# Patient Record
Sex: Male | Born: 2000 | Race: Black or African American | Hispanic: No | Marital: Single | Smoking: Former smoker
Health system: Southern US, Community
[De-identification: ages and names within clinical notes are randomized; demographics above are authoritative.]

## PROBLEM LIST (undated history)

## (undated) HISTORY — PX: SHOULDER SURGERY: SHX246

---
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
## 2021-06-25 ENCOUNTER — Encounter (HOSPITAL_COMMUNITY): Payer: Self-pay

## 2021-06-25 ENCOUNTER — Other Ambulatory Visit: Payer: Self-pay

## 2021-06-25 ENCOUNTER — Emergency Department (HOSPITAL_COMMUNITY)
Admission: EM | Admit: 2021-06-25 | Discharge: 2021-06-25 | Disposition: A | Discharge status: Home / Self Care | Payer: PRIVATE HEALTH INSURANCE | Attending: Emergency Medicine | Admitting: Emergency Medicine

## 2021-06-25 ENCOUNTER — Emergency Department (HOSPITAL_COMMUNITY): Payer: PRIVATE HEALTH INSURANCE

## 2021-06-25 DIAGNOSIS — R1013 Epigastric pain: Secondary | ICD-10-CM | POA: Diagnosis present

## 2021-06-25 DIAGNOSIS — R112 Nausea with vomiting, unspecified: Secondary | ICD-10-CM | POA: Insufficient documentation

## 2021-06-25 LAB — CBC WITH DIFFERENTIAL/PLATELET
Abs Immature Granulocytes: 0 10*3/uL (ref 0.00–0.07)
Basophils Absolute: 0.1 10*3/uL (ref 0.0–0.1)
Basophils Relative: 1 %
Eosinophils Absolute: 0.3 10*3/uL (ref 0.0–0.5)
Eosinophils Relative: 6 %
HCT: 42.6 % (ref 39.0–52.0)
Hemoglobin: 14.8 g/dL (ref 13.0–17.0)
Immature Granulocytes: 0 %
Lymphocytes Relative: 50 %
Lymphs Abs: 2.2 10*3/uL (ref 0.7–4.0)
MCH: 31.2 pg (ref 26.0–34.0)
MCHC: 34.7 g/dL (ref 30.0–36.0)
MCV: 89.7 fL (ref 80.0–100.0)
Monocytes Absolute: 0.4 10*3/uL (ref 0.1–1.0)
Monocytes Relative: 8 %
Neutro Abs: 1.5 10*3/uL — ABNORMAL LOW (ref 1.7–7.7)
Neutrophils Relative %: 35 %
Platelets: 229 10*3/uL (ref 150–400)
RBC: 4.75 MIL/uL (ref 4.22–5.81)
RDW: 13.1 % (ref 11.5–15.5)
WBC: 4.4 10*3/uL (ref 4.0–10.5)
nRBC: 0 % (ref 0.0–0.2)

## 2021-06-25 LAB — URINALYSIS, ROUTINE W REFLEX MICROSCOPIC
Bacteria, UA: NONE SEEN
Bilirubin Urine: NEGATIVE
Glucose, UA: NEGATIVE mg/dL
Hgb urine dipstick: NEGATIVE
Ketones, ur: NEGATIVE mg/dL
Leukocytes,Ua: NEGATIVE
Nitrite: NEGATIVE
Protein, ur: NEGATIVE mg/dL
Specific Gravity, Urine: 1.009 (ref 1.005–1.030)
pH: 6 (ref 5.0–8.0)

## 2021-06-25 LAB — COMPREHENSIVE METABOLIC PANEL
ALT: 13 U/L (ref 0–44)
AST: 15 U/L (ref 15–41)
Albumin: 4.5 g/dL (ref 3.5–5.0)
Alkaline Phosphatase: 45 U/L (ref 38–126)
Anion gap: 4 — ABNORMAL LOW (ref 5–15)
BUN: 9 mg/dL (ref 6–20)
CO2: 27 mmol/L (ref 22–32)
Calcium: 8.8 mg/dL — ABNORMAL LOW (ref 8.9–10.3)
Chloride: 108 mmol/L (ref 98–111)
Creatinine, Ser: 1.18 mg/dL (ref 0.61–1.24)
GFR, Estimated: >60 mL/min (ref >60)
Glucose, Bld: 93 mg/dL (ref 70–99)
Potassium: 3.9 mmol/L (ref 3.5–5.1)
Sodium: 139 mmol/L (ref 135–145)
Total Bilirubin: 0.5 mg/dL (ref 0.3–1.2)
Total Protein: 7.3 g/dL (ref 6.5–8.1)

## 2021-06-25 LAB — LIPASE, BLOOD: Lipase: 27 U/L (ref 11–51)

## 2021-06-25 MED ORDER — METOCLOPRAMIDE HCL 10 MG PO TABS: 10.0000 mg | ORAL_TABLET | Freq: Four times a day (QID) | ORAL | 0 refills | Status: AC

## 2021-06-25 MED ORDER — LIDOCAINE VISCOUS HCL 2 % MT SOLN
15.0000 mL | Freq: Once | OROMUCOSAL | Status: AC
  Administered 2021-06-25: 15 mL via ORAL
  Filled 2021-06-25: qty 15

## 2021-06-25 MED ORDER — ALUM & MAG HYDROXIDE-SIMETH 200-200-20 MG/5ML PO SUSP
30.0000 mL | Freq: Once | ORAL | Status: AC
  Administered 2021-06-25: 30 mL via ORAL
  Filled 2021-06-25: qty 30

## 2021-06-25 MED ORDER — METOCLOPRAMIDE HCL 10 MG PO TABS
10.0000 mg | ORAL_TABLET | Freq: Once | ORAL | Status: AC
  Administered 2021-06-25: 10 mg via ORAL
  Filled 2021-06-25: qty 1

## 2021-06-25 MED ORDER — FAMOTIDINE 20 MG PO TABS
40.0000 mg | ORAL_TABLET | Freq: Once | ORAL | Status: AC
  Administered 2021-06-25: 40 mg via ORAL
  Filled 2021-06-25: qty 2

## 2021-06-25 MED ORDER — DICYCLOMINE HCL 20 MG PO TABS: 20.0000 mg | ORAL_TABLET | Freq: Two times a day (BID) | ORAL | 0 refills | Status: AC

## 2021-06-25 MED ORDER — PANTOPRAZOLE SODIUM 20 MG PO TBEC: 20.0000 mg | DELAYED_RELEASE_TABLET | Freq: Every day | ORAL | 0 refills | Status: AC

## 2021-06-25 NOTE — ED Triage Notes (Signed)
Pt reports with a pain in his sternum that goes through to his back. Pt reports feeling this pain for the past month and more after he eats or takes a deep breath.  ?

## 2021-06-25 NOTE — Discharge Instructions (Addendum)
Please take medications as prescribed.  Please follow-up with one of the gastroenterology group that I have given you the information for. ? ?Recommend bland foods.  I have printed you instructions on this diet. ?

## 2021-06-25 NOTE — ED Provider Notes (Signed)
?[facility] DEPT ?Provider Note ? ? ?CSN: [id number] ?Arrival date & time: 06/25/21  [id number] ? ?  ? ?History ? ?Chief Complaint  ?Patient presents with  ? Abdominal Pain  ? Back Pain  ? ? ?Eugene Johnston is a 21 y.o. male. ? ? ?Abdominal Pain ?Back Pain ?Associated symptoms: abdominal pain   ? ?Patient is a 21 year old male presented to the emergency room today with complaints of epigastric abdominal pain seems to radiate to his back.  He has no history of gallstones, pancreatitis denies alcohol use apart from occasional drinks a couple times a month.  He states that the pain seems to occur more after he eats especially heavy meals.  He endorses some nausea and had 2 episodes of vomiting over the past 24 hours that were nonbloody nonbilious.  Denies any dark or tarry appearing stool.  States occasionally have some mild loose stool no watery diarrhea.  No fevers or chills. ? ?No shortness of breath or lightheadedness.   ? ?  ? ?Home Medications ?Prior to Admission medications   ?Medication Sig Start Date End Date Taking? Authorizing Provider  ?dicyclomine (BENTYL) 20 MG tablet Take 1 tablet (20 mg total) by mouth 2 (two) times daily. 06/25/21  Yes Tedd Sias, PA  ?metoCLOPramide (REGLAN) 10 MG tablet Take 1 tablet (10 mg total) by mouth every 6 (six) hours. 06/25/21  Yes Josiel Gahm, Ova Freshwater S, PA  ?pantoprazole (PROTONIX) 20 MG tablet Take 1 tablet (20 mg total) by mouth daily. 06/25/21  Yes Tedd Sias, PA  ?doxycycline (VIBRAMYCIN) 100 MG capsule Take 1 capsule (100 mg total) by mouth 2 (two) times daily. One po bid x 7 days 01/14/18   Milton Ferguson, MD  ?ibuprofen (MOTRIN IB) 200 MG tablet Take 3 tablets (600 mg total) by mouth every 6 (six) hours as needed. ?Patient not taking: Reported on 01/14/2018 07/29/17   Carrie Mew, MD  ?traMADol (ULTRAM) 50 MG tablet Take 1 tablet (50 mg total) by mouth every 6 (six) hours as needed. 01/14/18   Milton Ferguson, MD  ?   ? ?Allergies     ?Benadryl [diphenhydramine]   ? ?Review of Systems   ?Review of Systems  ?Gastrointestinal:  Positive for abdominal pain.  ?Musculoskeletal:  Positive for back pain.  ? ?Physical Exam ?Updated Vital Signs ?BP (!) 148/93   Pulse 79   Temp 98.2 ?F (36.8 ?C) (Oral)   Resp 16   Ht 5\' 9"  (1.753 m)   Wt 79.4 kg   SpO2 100%   BMI 25.84 kg/m?  ?Physical Exam ?Vitals and nursing note reviewed.  ?Constitutional:   ?   General: He is not in acute distress. ?HENT:  ?   Head: Normocephalic and atraumatic.  ?   Nose: Nose normal.  ?Eyes:  ?   General: No scleral icterus. ?Cardiovascular:  ?   Rate and Rhythm: Normal rate and regular rhythm.  ?   Pulses: Normal pulses.  ?   Heart sounds: Normal heart sounds.  ?Pulmonary:  ?   Effort: Pulmonary effort is normal. No respiratory distress.  ?   Breath sounds: No wheezing.  ?Abdominal:  ?   Palpations: Abdomen is soft.  ?   Tenderness: There is abdominal tenderness in the epigastric area.  ?   Comments: Mild epigastric TTP ?No RUQ TTP, no guarding or rebound.  Negative Murphy sign  ?Musculoskeletal:  ?   Cervical back: Normal range of motion.  ?   Right lower leg: No  edema.  ?   Left lower leg: No edema.  ?Skin: ?   General: Skin is warm and dry.  ?   Capillary Refill: Capillary refill takes less than 2 seconds.  ?Neurological:  ?   Mental Status: He is alert. Mental status is at baseline.  ?Psychiatric:     ?   Mood and Affect: Mood normal.     ?   Behavior: Behavior normal.  ? ? ?ED Results / Procedures / Treatments   ?Labs ?(all labs ordered are listed, but only abnormal results are displayed) ?Labs Reviewed  ?COMPREHENSIVE METABOLIC PANEL - Abnormal; Notable for the following components:  ?    Result Value  ? Calcium 8.8 (*)   ? Anion gap 4 (*)   ? All other components within normal limits  ?CBC WITH DIFFERENTIAL/PLATELET - Abnormal; Notable for the following components:  ? Neutro Abs 1.5 (*)   ? All other components within normal limits  ?URINALYSIS, ROUTINE W REFLEX  MICROSCOPIC - Abnormal; Notable for the following components:  ? Color, Urine STRAW (*)   ? All other components within normal limits  ?LIPASE, BLOOD  ? ? ?EKG ?None ? ?Radiology ?DG Chest 2 View ? ?Result Date: 06/25/2021 ?CLINICAL DATA:  Upper abdominal pain, mid chest pain. EXAM: CHEST - 2 VIEW COMPARISON:  01/14/2018. FINDINGS: The heart size and mediastinal contours are within normal limits. No consolidation, effusion, or pneumothorax. No acute osseous abnormality. IMPRESSION: No active cardiopulmonary disease. Electronically Signed   By: Brett Fairy M.D.   On: 06/25/2021 04:18   ? ?Procedures ?Procedures  ? ? ?Medications Ordered in ED ?Medications  ?alum & mag hydroxide-simeth (MAALOX/MYLANTA) 200-200-20 MG/5ML suspension 30 mL (30 mLs Oral Given 06/25/21 0349)  ?  And  ?lidocaine (XYLOCAINE) 2 % viscous mouth solution 15 mL (15 mLs Oral Given 06/25/21 0349)  ?famotidine (PEPCID) tablet 40 mg (40 mg Oral Given 06/25/21 0401)  ?metoCLOPramide (REGLAN) tablet 10 mg (10 mg Oral Given 06/25/21 0400)  ? ? ?ED Course/ Medical Decision Making/ A&P ?Clinical Course as of 06/25/21 0444  ?Tue Jun 25, 2021  ?0421 I personally reviewed x-ray of chest images.  Agree with radiology read no acute abnormality. ?No pneumothorax and no air under diaphragm.  Doubt perforation [WF]  ?  ?Clinical Course User Index ?[WF] Tedd Sias, [place]  ? ?                        ?Medical Decision Making ?Amount and/or Complexity of Data Reviewed ?Labs: ordered. ?Radiology: ordered. ? ?Risk ?OTC drugs. ?Prescription drug management. ? ? ?This patient presents to the ED for concern of epigastric abd pain, this involves a number of treatment options, and is a complaint that carries with it a moderate/high risk of complications and morbidity.  The differential diagnosis includes The causes of generalized abdominal pain include but are not limited to AAA, mesenteric ischemia, appendicitis, diverticulitis, DKA, gastritis, gastroenteritis, AMI,  nephrolithiasis, pancreatitis, peritonitis, adrenal insufficiency,lead poisoning, iron toxicity, intestinal ischemia, constipation, UTI,SBO/LBO, splenic rupture, biliary disease, IBD, IBS, PUD, or hepatitis. ? ? ?Given the patient's epigastric tenderness on palpation I have a low suspicion for chest etiology of his symptoms. ? ? ?I did consider chest etiology such as pneumothorax, ACS, myocarditis, pericarditis ? ?Low suspicion for this given the epigastric tenderness ? ?Co morbidities: ?Discussed in HPI ? ? ?Brief History: ? ?Patient is a 21 year old male presented to the emergency room today with complaints of epigastric abdominal  pain seems to radiate to his back.  He has no history of gallstones, pancreatitis denies alcohol use apart from occasional drinks a couple times a month.  He states that the pain seems to occur more after he eats especially heavy meals.  He endorses some nausea and had 2 episodes of vomiting over the past 24 hours that were nonbloody nonbilious.  Denies any dark or tarry appearing stool.  States occasionally have some mild loose stool no watery diarrhea.  No fevers or chills. ? ?No shortness of breath or lightheadedness.   ? ? ? ?Physical exam with mild epigastric tenderness.  No guarding or rebound. ? ? ?EMR reviewed including pt PMHx, past surgical history and past visits to ER.  ? ?See HPI for more details ? ? ?Lab Tests: ? ? ?I personally reviewed all laboratory work and imaging. Metabolic panel without any acute abnormality specifically kidney function within normal limits and no significant electrolyte abnormalities. CBC without leukocytosis or significant anemia. ? ? ?Imaging Studies: ? ?NAD. I personally reviewed all imaging studies and no acute abnormality found. I agree with radiology interpretation. ?Chest x-ray obtained and there is no free air under the diaphragm or pneumothorax.  I personally viewed these images ? ? ?Cardiac Monitoring: ? ?NA ?NA ? ? ?Medicines  ordered: ? ?I ordered medication including GI cocktail, Pepcid, Reglan for abdominal pain ?Reevaluation of the patient after these medicines showed that the patient improved ?I have reviewed the patients home medicines and have made a

## 2022-09-17 IMAGING — CR DG CHEST 2V
2 series · 2 of 2 positions shown · non-contrast
Comparison: 01/14/2018.

CLINICAL DATA: Upper abdominal pain, mid chest pain.

EXAM:
CHEST - 2 VIEW

[w chest pa]
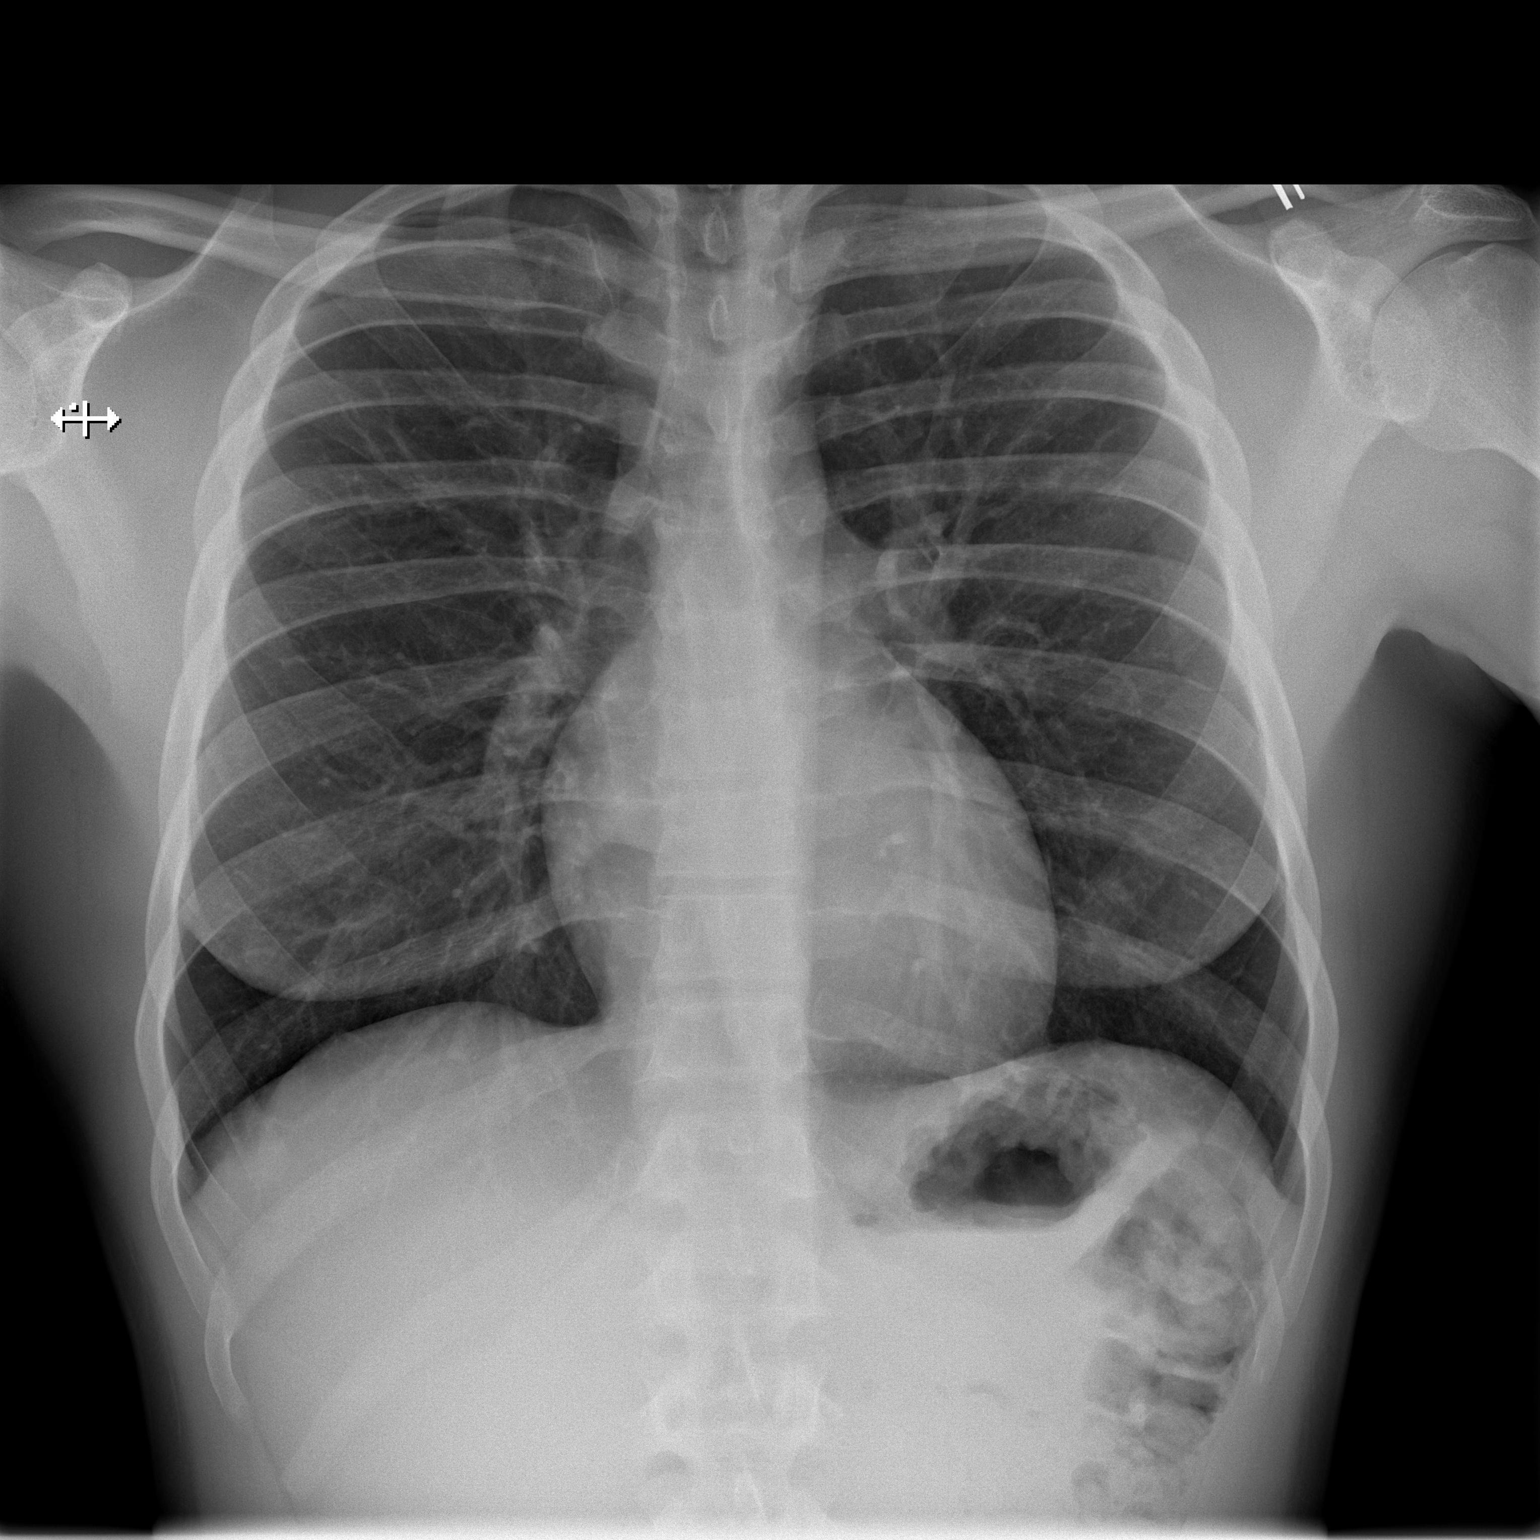

[w chest lat]
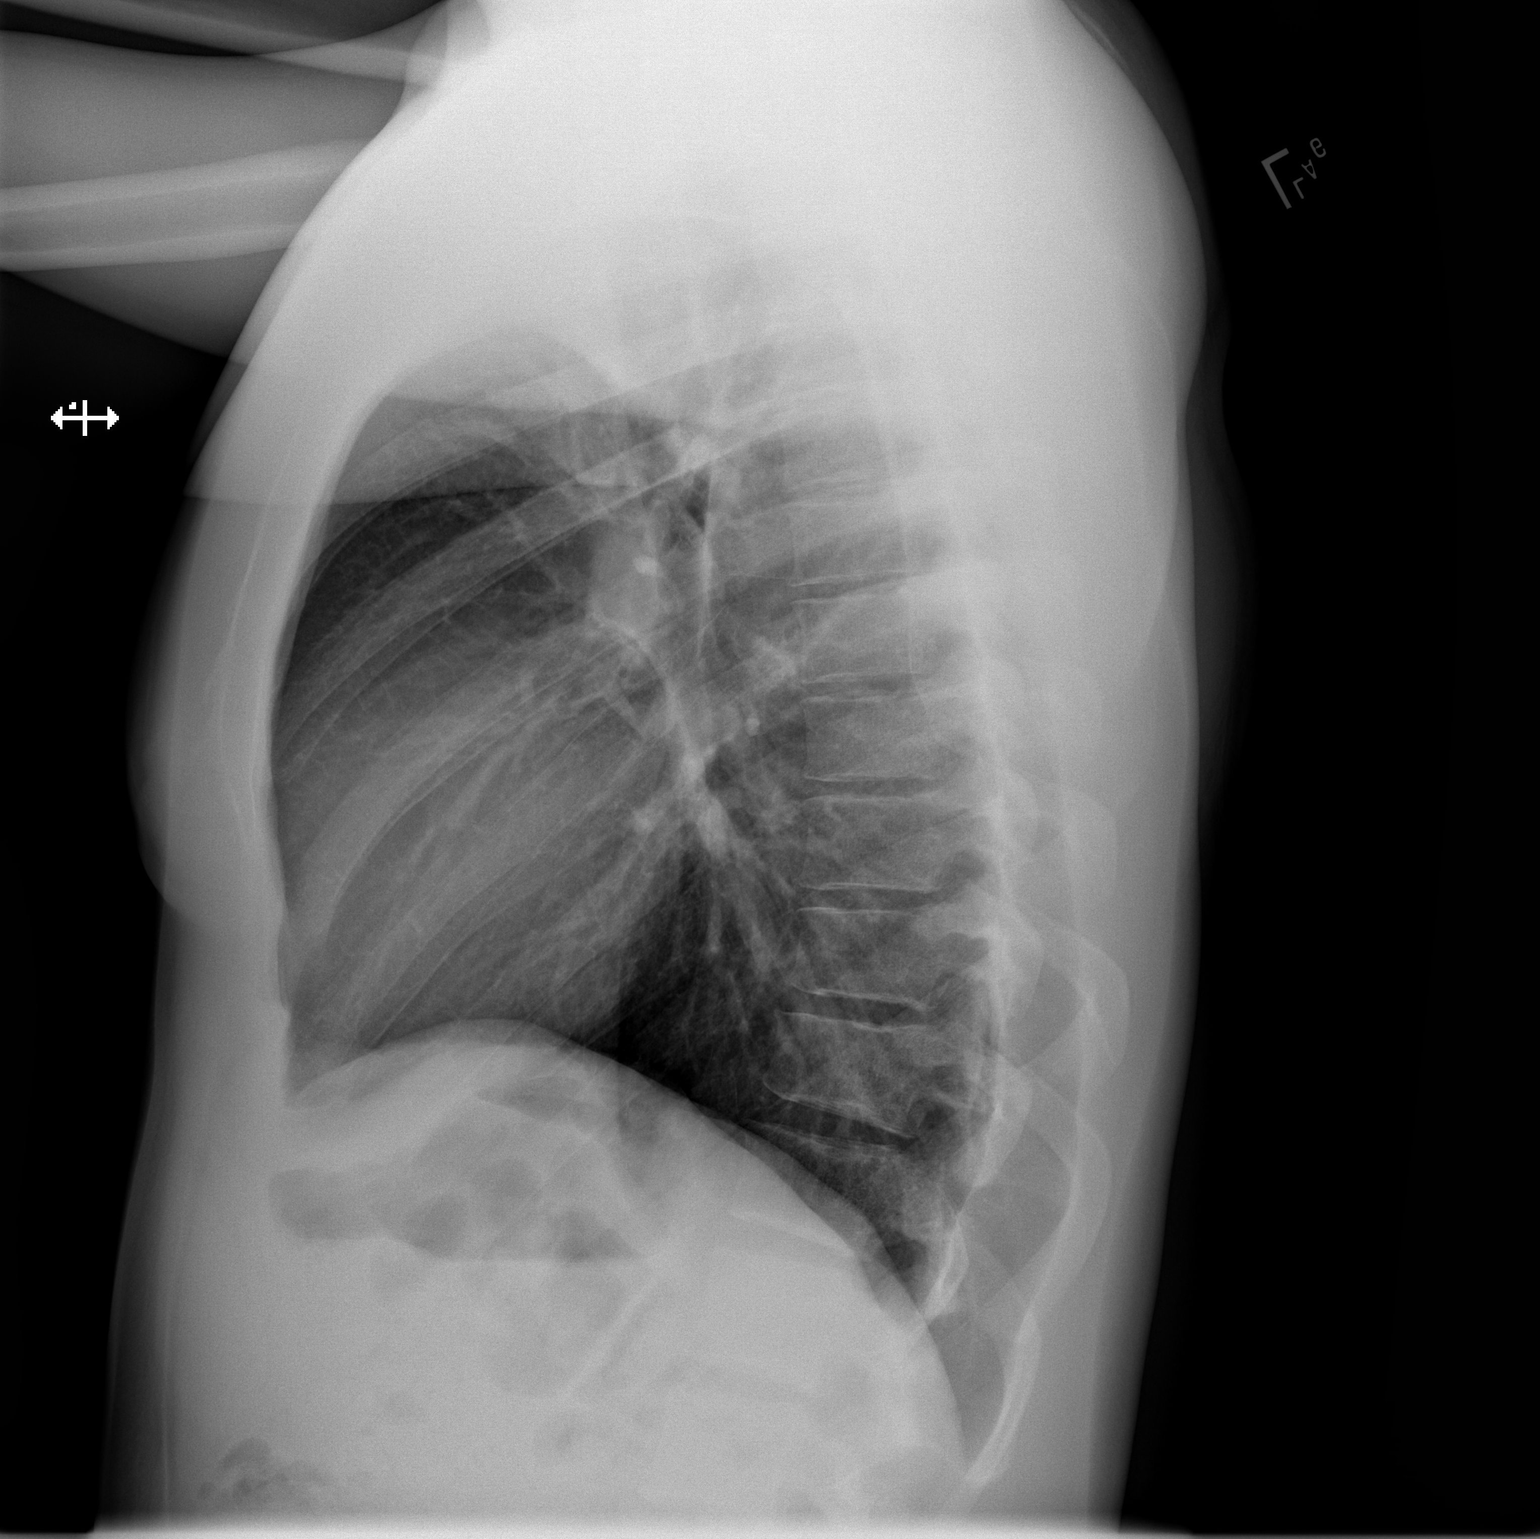

[2 of 2 positions shown; findings below may reference images not displayed]

FINDINGS: The heart size and mediastinal contours are within normal limits. No
consolidation, effusion, or pneumothorax. No acute osseous
abnormality.
IMPRESSION: No active cardiopulmonary disease.
